# Patient Record
Sex: Male | Born: 1950 | Race: Black or African American | Hispanic: No | Marital: Married | Smoking: Former smoker
Health system: Southern US, Community
[De-identification: ages and names within clinical notes are randomized; demographics above are authoritative.]

## PROBLEM LIST (undated history)

## (undated) DIAGNOSIS — H409 Unspecified glaucoma: Secondary | ICD-10-CM

## (undated) DIAGNOSIS — E119 Type 2 diabetes mellitus without complications: Secondary | ICD-10-CM

## (undated) DIAGNOSIS — J189 Pneumonia, unspecified organism: Secondary | ICD-10-CM

## (undated) DIAGNOSIS — E7800 Pure hypercholesterolemia, unspecified: Secondary | ICD-10-CM

## (undated) DIAGNOSIS — I10 Essential (primary) hypertension: Secondary | ICD-10-CM

## (undated) DIAGNOSIS — C801 Malignant (primary) neoplasm, unspecified: Secondary | ICD-10-CM

## (undated) DIAGNOSIS — S0990XA Unspecified injury of head, initial encounter: Secondary | ICD-10-CM

## (undated) DIAGNOSIS — M1990 Unspecified osteoarthritis, unspecified site: Secondary | ICD-10-CM

## (undated) DIAGNOSIS — G4730 Sleep apnea, unspecified: Secondary | ICD-10-CM

## (undated) HISTORY — PX: CRANIECTOMY FOR DEPRESSED SKULL FRACTURE: SHX5788

## (undated) HISTORY — PX: MANDIBLE SURGERY: SHX707

## (undated) HISTORY — PX: PROSTATECTOMY: SHX69

---

## 1987-06-14 DIAGNOSIS — S0990XA Unspecified injury of head, initial encounter: Secondary | ICD-10-CM

## 1987-06-14 HISTORY — DX: Unspecified injury of head, initial encounter: S09.90XA

## 2009-06-13 DEATH — deceased

## 2010-03-27 ENCOUNTER — Emergency Department: Payer: Self-pay | Admitting: Emergency Medicine

## 2010-05-19 ENCOUNTER — Ambulatory Visit: Payer: Self-pay | Admitting: Family Medicine

## 2010-05-19 ENCOUNTER — Encounter (INDEPENDENT_AMBULATORY_CARE_PROVIDER_SITE_OTHER): Payer: Self-pay

## 2010-05-19 DIAGNOSIS — M25569 Pain in unspecified knee: Secondary | ICD-10-CM

## 2010-05-19 DIAGNOSIS — M1990 Unspecified osteoarthritis, unspecified site: Secondary | ICD-10-CM | POA: Insufficient documentation

## 2010-05-19 DIAGNOSIS — I10 Essential (primary) hypertension: Secondary | ICD-10-CM | POA: Insufficient documentation

## 2010-05-19 DIAGNOSIS — N529 Male erectile dysfunction, unspecified: Secondary | ICD-10-CM

## 2010-07-13 NOTE — Letter (Signed)
Summary: Generic Letter  [facility]  [address]   [place], [place] [address]   Phone: [phone]  Fax: [phone]    05/19/2010  JORELL AGNE [address] [place], [place]  [address]   Bilateral Knees Xray with standing views  diagnoses: bilateral knee pain, osteoarthritis (715.98)  Please fax results to [phone]       Sincerely,   Standley Dakins MD

## 2010-07-13 NOTE — Assessment & Plan Note (Signed)
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  Left greater than right. Pt feeling pain behind the knee cap.  Pt says that he is also out of his ED medication and needs refill.  He says that he normally sees the [facility] as his PCP.  Pt says that he has never had xrays of his knees and never seen an orthopedist.  He is in a physically demanding job situation and will need to rely on the use of his knees without having constant pain.  He says that he quit smoking nearly 30 years ago.  He says that he is taking his BP medications but he does forget to take it on occasion and last night forgot to take it.          Physical Exam General appearance: well developed, well nourished, no acute distress Head: normocephalic, atraumatic Eyes: esotropia strabismus noted Pupils: equal, round, reactive to light Ears: normal, no lesions or deformities Nasal: mucosa pink, nonedematous, no septal deviation, turbinates normal Oral/Pharynx: tongue normal, posterior pharynx without erythema or exudate Neck: neck supple,  trachea midline, no masses Chest/Lungs: no rales, wheezes, or rhonchi bilateral, breath sounds equal without effort Heart: regular rate and  rhythm, no murmur Abdomen: soft, non-tender without obvious organomegaly Extremities: crepitus and medial tenderness in left knee, minimal crepitus in right knee, left joint stable, right joint stable,  Neurological: grossly intact and non-focal Skin: no obvious rashes or lesions MSE: oriented to time, place, and person  Family History: Father  -  Heart Disease  Social History: Pt quit smoking 20+ years ago,  Pt has rare alcohol consumption, Denies Recreational Drugs.    Past History:  Family History: Last updated:  05/19/2010 Father  -  Heart Disease  Social History: Last updated: 05/19/2010 Pt quit smoking 20+ years ago,  Pt has rare alcohol consumption, Denies Recreational Drugs.  Past Medical History: Hypertension Osteoarthritis BPH Erectile Dysfunction  Past Surgical History: Prostatectomy          Vital Signs:  Patient Profile:   60 Years Old Male CC:      Bilateral Knee Pain Height:     68 inches Weight:      193 pounds BMI:     29.45 O2 Sat:      96 % O2 treatment:    Room Air Temp:     97.9 degrees F oral Pulse rate:   77 / minute Pulse rhythm:   regular Resp:     18 per minute BP sitting:   138 / 88  (right arm)  Pt. in pain?   yes    Location:   knee    Intensity:   7    Type:       aching  Vitals Entered By: Levonne Spiller EMT-P (May 19, 2010 3:18 PM)              Is Patient Diabetic? No  Does patient need assistance? Functional Status Self care Ambulation Normal    REVIEW OF SYSTEMS Constitutional Symptoms      Denies fever, chills, night sweats, weight loss, weight gain, and fatigue.  Eyes       Complains of eye drainage.      Denies change in vision, eye pain, glasses, contact lenses, and eye surgery.  Ear/Nose/Throat/Mouth       Denies hearing loss/aids, change in hearing, ear pain, ear discharge, dizziness, frequent runny nose, frequent nose bleeds, sinus problems, sore throat, hoarseness, and tooth pain or bleeding.  Respiratory       Denies dry cough, productive cough, wheezing, shortness of breath, asthma, bronchitis, and emphysema/COPD.  Cardiovascular       Denies murmurs, chest pain, and tires easily with exhertion.    Gastrointestinal       Denies stomach pain, nausea/vomiting, diarrhea, constipation, blood in bowel movements, and indigestion. Genitourniary       Denies painful urination, kidney stones, and loss of urinary control. Neurological       Denies paralysis, seizures, and fainting/blackouts. Musculoskeletal        Complains of joint pain and joint stiffness.      Denies muscle pain, decreased range of motion, redness, swelling, muscle weakness, and gout.  Skin       Denies bruising, unusual mles/lumps or sores, and hair/skin or nail changes.  Psych       Denies mood changes, temper/anger issues, anxiety/stress, speech problems, depression, and sleep problems. Assessment & Plan:  Medical Problems Added: 1)  Dx of Knee Pain, Left  (ICD-719.46) 2)  Dx of Erectile Dysfunction, Organic  (ICD-607.84) 3)  Dx of Unspecified Essential Hypertension  (ICD-401.9) 4)  Dx of Osteoarthros Unspec Gen/loc Oth Spec Sites  (ICD-715.98)  Updated Medical Problems: 1)  Dx of Knee Pain, Left  (ICD-719.46) 2)  Dx of Erectile Dysfunction, Organic  (ICD-607.84) 3)  Dx of Unspecified Essential Hypertension  (ICD-401.9) 4)  Dx of Osteoarthros Unspec Gen/loc Oth Spec Sites  (ICD-715.98)  New Prescriptions/Refills: 1)  Amlodipine Besylate 10 Mg Tabs (Amlodipine besylate) .Marland Kitchen.. 1 per day 2)  Flunisolide 0.025 % Soln (Flunisolide) .... Twice per day 3)  Loratadine Allergy Relief 10 Mg Tbdp (Loratadine) .Marland Kitchen.. 1 per day 4)  Simvastatin 10 Mg Tabs (Simvastatin) .Marland Kitchen.. 1 per day 5)  Meloxicam 7.5 Mg Tabs (Meloxicam) .... Take 1 tablet by mouth daily with food as needed for knee pain 6)  Levitra 20 Mg Tabs (Vardenafil hcl) .... Use as directed - avoid nitroglycerin containing products  Current Medication List: 1)  Amlodipine Besylate 10 Mg Tabs (Amlodipine besylate) .Marland Kitchen.. 1 per day 2)  Flunisolide 0.025 % Soln (Flunisolide) .... Twice per day 3)  Loratadine Allergy Relief 10 Mg Tbdp (Loratadine) .Marland Kitchen.. 1 per day 4)  Simvastatin 10 Mg Tabs (Simvastatin) .Marland Kitchen.. 1 per day 5)  Meloxicam 7.5 Mg Tabs (Meloxicam) .... Take 1 tablet by mouth daily with food as needed for knee pain 6)  Levitra 20 Mg Tabs (Vardenafil hcl) .... Use as directed - avoid nitroglycerin containing products  New Orders: 1)  Orthopedic Referral [Ortho]  Disposition:  return to clinic in 2 weeks  Additional Plan/Instructions: 1)  Orthopedic Referral Made 2)  Meloxicam 7.5 mg by mouth daily with food for knee pain

## 2010-07-13 NOTE — Miscellaneous (Signed)
Summary: Orthopedics Referral  Clinical Lists Changes  Pt. has an appt. on Tues. 05/25/2010 at 10:30am, with [facility]- Dr. Martha Clan. Pt. being seen for left knee pain./ rwt

## 2010-08-13 ENCOUNTER — Encounter: Payer: Self-pay | Admitting: Family Medicine

## 2010-08-27 ENCOUNTER — Encounter: Payer: Self-pay | Admitting: Family Medicine

## 2010-08-27 ENCOUNTER — Ambulatory Visit (INDEPENDENT_AMBULATORY_CARE_PROVIDER_SITE_OTHER): Payer: BC Managed Care – PPO | Admitting: Family Medicine

## 2010-08-27 DIAGNOSIS — M1990 Unspecified osteoarthritis, unspecified site: Secondary | ICD-10-CM

## 2010-08-27 DIAGNOSIS — M545 Low back pain, unspecified: Secondary | ICD-10-CM

## 2010-08-31 NOTE — Assessment & Plan Note (Signed)
Summary: Back Pain   Vital Signs:  Patient Profile:   60 Years Old Male CC:      Back Pain Height:     68 inches Weight:      196 pounds O2 Sat:      97 % O2 treatment:    Room Air Temp:     98.2 degrees F oral Pulse rate:   76 / minute Pulse rhythm:   regular Resp:     13 per minute BP sitting:   136 / 87  (left arm) Cuff size:   regular  Pt. in pain?   yes    Location:   back    Intensity:   6/10    Type:       aching                   Current Allergies (reviewed today): ! LISINOPRILHistory of Present Illness History from: patient Reason for visit: see chief complaint Chief Complaint: Back Pain History of Present Illness: The patient is presenting today because he was not able to go to work this morning.  He is having lower back pain.  He says that he was working out and lifting weights yesterday and noticed some pain in the lower back when bending over.  He says that it is 6/10 and aching in the spine area of the lower back at the tailbone area.  He says he has no loss of strength or sensation.  He has no loss of bowel or bladder control.  He says that he usually feels spasm in the back with bending or certain movements.   He says that he has  a back brace to wear.  He says that he went to the orthopedist and was given medication but he stopped taking it.  He says that his knees still hurt.  He says that he has no abdominal pain or chest pains.     REVIEW OF SYSTEMS Constitutional Symptoms      Denies fever, chills, night sweats, weight loss, weight gain, and fatigue.  Eyes       Complains of eye drainage.      Denies change in vision, eye pain, glasses, contact lenses, and eye surgery. Ear/Nose/Throat/Mouth       Denies hearing loss/aids, change in hearing, ear pain, ear discharge, dizziness, frequent runny nose, frequent nose bleeds, sinus problems, sore throat, hoarseness, and tooth pain or bleeding.  Respiratory       Denies dry cough, productive cough, wheezing,  shortness of breath, asthma, bronchitis, and emphysema/COPD.  Cardiovascular       Denies murmurs, chest pain, and tires easily with exhertion.    Gastrointestinal       Denies stomach pain, nausea/vomiting, diarrhea, constipation, blood in bowel movements, and indigestion. Genitourniary       Denies painful urination, kidney stones, and loss of urinary control. Neurological       Denies paralysis, seizures, and fainting/blackouts. Musculoskeletal       Complains of joint pain.      Denies muscle pain, joint stiffness, decreased range of motion, redness, swelling, muscle weakness, and gout.      Comments: low back pain, bilateral knee pain;  Skin       Denies bruising, unusual mles/lumps or sores, and hair/skin or nail changes.  Psych       Denies mood changes, temper/anger issues, anxiety/stress, speech problems, depression, and sleep problems.  Past History:  Family History: Last updated: 05/19/2010 Father  -  Heart Disease  Social History: Last updated: 08/27/2010 Pt quit smoking 20+ years ago,  Pt has rare alcohol consumption, Denies Recreational Drugs. Pt works as a Biochemist, clinical for [employer].   Past Medical History: Hypertension Osteoarthritis BPH Erectile Dysfunction bilateral DDD knees  Past Surgical History: Reviewed history from 05/19/2010 and no changes required. Prostatectomy  Family History: Reviewed history from 05/19/2010 and no changes required. Father  -  Heart Disease  Social History: Pt quit smoking 20+ years ago,  Pt has rare alcohol consumption, Denies Recreational Drugs. Pt works as a Biochemist, clinical for [employer].  Physical Exam General appearance: well developed, well nourished, no acute distress Head: normocephalic, atraumatic Eyes: conjunctivae and lids normal Pupils: equal, round, reactive to light Ears: normal, no lesions or deformities Nasal: mucosa pink, nonedematous, no septal  deviation, turbinates normal Oral/Pharynx: tongue normal, posterior pharynx without erythema or exudate Neck: neck supple,  trachea midline, no masses Chest/Lungs: no rales, wheezes, or rhonchi bilateral, breath sounds equal without effort Heart: regular rate and  rhythm, no murmur Abdomen: soft, non-tender without obvious organomegaly GU: no suprapubic TTP   Extremities: normal extremities Neurological: grossly intact and non-focal Back: tender musculature paraspinal muscles lower back,  straight leg raises negative on left but positive on Right leg at 40 deg;   deep tendon reflexes 2+ at achilles and patella Skin: no obvious rashes or lesions MSE: oriented to time, place, and person Assessment New Problems: LOW BACK PAIN, ACUTE (ICD-724.2)   Patient Education: Patient and/or caregiver instructed in the following: rest, fluids, Tylenol prn, Ibuprofen prn. The risks, benefits and possible side effects were clearly explained and discussed with the patient.  The patient verbalized clear understanding.  The patient was given instructions to return if symptoms don't improve, worsen or new changes develop.  If it is not during clinic hours and the patient cannot get back to this clinic then the patient was told to seek medical care at an available urgent care or emergency department.  The patient verbalized understanding.   Demonstrates willingness to comply.  Plan New Medications/Changes: CYCLOBENZAPRINE HCL 5 MG TABS (CYCLOBENZAPRINE HCL) take 1 by mouth at hs as needed back spasm: Caution will cause drowsiness  #7 x 0, 08/27/2010, Haillie Radu MD TRAMADOL HCL 50 MG TABS (TRAMADOL HCL) take 1 by mouth every 6 hours as needed severe low back pain: Caution will cause drowsiness  #20 x 0, 08/27/2010, Shontel Santee MD IBUPROFEN 800 MG TABS (IBUPROFEN) take 1 by mouth every 8 hours with food as needed back pain.  #15 x 0, 08/27/2010, Standley Dakins MD  Follow Up: Follow up in 2-3 days if  no improvement, Follow up on an as needed basis, Follow up with Primary Physician  The patient and/or caregiver has been counseled thoroughly with regard to medications prescribed including dosage, schedule, interactions, rationale for use, and possible side effects and they verbalize understanding.  Diagnoses and expected course of recovery discussed and will return if not improved as expected or if the condition worsens. Patient and/or caregiver verbalized understanding.  Prescriptions: CYCLOBENZAPRINE HCL 5 MG TABS (CYCLOBENZAPRINE HCL) take 1 by mouth at hs as needed back spasm: Caution will cause drowsiness  #7 x 0   Entered and Authorized by:   Standley Dakins MD   Signed by:   Standley Dakins MD on 08/27/2010   Method used:   Handwritten   RxID:   [id number] TRAMADOL HCL 50 MG TABS (TRAMADOL HCL) take 1 by  mouth every 6 hours as needed severe low back pain: Caution will cause drowsiness  #20 x 0   Entered and Authorized by:   Standley Dakins MD   Signed by:   Standley Dakins MD on 08/27/2010   Method used:   Handwritten   RxID:   [id number] IBUPROFEN 800 MG TABS (IBUPROFEN) take 1 by mouth every 8 hours with food as needed back pain.  #15 x 0   Entered and Authorized by:   Standley Dakins MD   Signed by:   Standley Dakins MD on 08/27/2010   Method used:   Handwritten   RxID:   [id number]   Patient Instructions: 1)  Take 650-1000mg  of Tylenol every 4-6 hours as needed for relief of pain or comfort of fever AVOID taking more than 4000mg   in a 24 hour period (can cause liver damage in higher doses). 2)  Take 800mg  of Ibuprofen (Advil, Motrin) with food every 8  hours as needed for relief of pain or comfort of fever. 3)  Most patients (90%) with low back pain will improve with time (2-6 weeks). Keep active but avoid activities that are painful. Apply moist heat and/or ice to lower back several times a day. 4)  Start wearing a back brace daily while at work until  back is feeling better.  5)  No heavy lifting over 15 lbs for next 2 weeks.  6)  Return or go to the ER if no improvement or symptoms getting worse.   7)  The patient was informed that there is no on-call provider or services available at this clinic during off-hours (when the clinic is closed).  If the patient developed a problem or concern that required immediate attention, the patient was advised to go the the nearest available urgent care or emergency department for medical care.  The patient verbalized understanding.       Pt advised to Return or go to the ER if no improvement or symptoms getting worse.  The patient verbalized clear understanding.  Rodney Langton, MD, CDE, [employer]

## 2010-09-09 NOTE — Letter (Signed)
Summary: history form   history form   Imported By: Eugenio Hoes 08/30/2010 13:39:05  _____________________________________________________________________  External Attachment:    Type:   Image     Comment:   External Document

## 2010-09-09 NOTE — Letter (Signed)
Summary: history form   history form   Imported By: Eugenio Hoes 08/30/2010 13:36:46  _____________________________________________________________________  External Attachment:    Type:   Image     Comment:   External Document

## 2013-06-13 DIAGNOSIS — J189 Pneumonia, unspecified organism: Secondary | ICD-10-CM

## 2013-06-13 HISTORY — DX: Pneumonia, unspecified organism: J18.9

## 2015-01-06 ENCOUNTER — Encounter
Admission: RE | Admit: 2015-01-06 | Discharge: 2015-01-06 | Disposition: A | Discharge status: Home / Self Care | Payer: Managed Care, Other (non HMO) | Source: Ambulatory Visit | Attending: Anesthesiology | Admitting: Anesthesiology

## 2015-01-06 ENCOUNTER — Encounter: Payer: Self-pay | Admitting: *Deleted

## 2015-01-06 DIAGNOSIS — Z818 Family history of other mental and behavioral disorders: Secondary | ICD-10-CM | POA: Diagnosis not present

## 2015-01-06 DIAGNOSIS — Z825 Family history of asthma and other chronic lower respiratory diseases: Secondary | ICD-10-CM | POA: Diagnosis not present

## 2015-01-06 DIAGNOSIS — D176 Benign lipomatous neoplasm of spermatic cord: Secondary | ICD-10-CM | POA: Diagnosis not present

## 2015-01-06 DIAGNOSIS — Z8249 Family history of ischemic heart disease and other diseases of the circulatory system: Secondary | ICD-10-CM | POA: Diagnosis not present

## 2015-01-06 DIAGNOSIS — M069 Rheumatoid arthritis, unspecified: Secondary | ICD-10-CM | POA: Diagnosis not present

## 2015-01-06 DIAGNOSIS — Z8261 Family history of arthritis: Secondary | ICD-10-CM | POA: Diagnosis not present

## 2015-01-06 DIAGNOSIS — M170 Bilateral primary osteoarthritis of knee: Secondary | ICD-10-CM | POA: Diagnosis not present

## 2015-01-06 DIAGNOSIS — R7309 Other abnormal glucose: Secondary | ICD-10-CM | POA: Diagnosis not present

## 2015-01-06 DIAGNOSIS — Z8546 Personal history of malignant neoplasm of prostate: Secondary | ICD-10-CM | POA: Diagnosis not present

## 2015-01-06 DIAGNOSIS — Z808 Family history of malignant neoplasm of other organs or systems: Secondary | ICD-10-CM | POA: Diagnosis not present

## 2015-01-06 DIAGNOSIS — H409 Unspecified glaucoma: Secondary | ICD-10-CM | POA: Diagnosis not present

## 2015-01-06 DIAGNOSIS — K4090 Unilateral inguinal hernia, without obstruction or gangrene, not specified as recurrent: Secondary | ICD-10-CM | POA: Diagnosis present

## 2015-01-06 DIAGNOSIS — Z87891 Personal history of nicotine dependence: Secondary | ICD-10-CM | POA: Diagnosis not present

## 2015-01-06 DIAGNOSIS — G4730 Sleep apnea, unspecified: Secondary | ICD-10-CM | POA: Diagnosis not present

## 2015-01-06 DIAGNOSIS — E780 Pure hypercholesterolemia: Secondary | ICD-10-CM | POA: Diagnosis not present

## 2015-01-06 DIAGNOSIS — Z801 Family history of malignant neoplasm of trachea, bronchus and lung: Secondary | ICD-10-CM | POA: Diagnosis not present

## 2015-01-06 DIAGNOSIS — Z888 Allergy status to other drugs, medicaments and biological substances status: Secondary | ICD-10-CM | POA: Diagnosis not present

## 2015-01-06 DIAGNOSIS — I10 Essential (primary) hypertension: Secondary | ICD-10-CM | POA: Diagnosis not present

## 2015-01-06 DIAGNOSIS — Z811 Family history of alcohol abuse and dependence: Secondary | ICD-10-CM | POA: Diagnosis not present

## 2015-01-06 DIAGNOSIS — Z79899 Other long term (current) drug therapy: Secondary | ICD-10-CM | POA: Diagnosis not present

## 2015-01-06 HISTORY — DX: Pneumonia, unspecified organism: J18.9

## 2015-01-06 NOTE — Patient Instructions (Signed)
  Your procedure is scheduled on: 01-09-15 Report to [place] To find out your arrival time please call [phone] between 1PM - 3PM on 01-08-15 (THURSDAY)  Remember: Instructions that are not followed completely may result in serious medical risk, up to and including death, or upon the discretion of your surgeon and anesthesiologist your surgery may need to be rescheduled.    __X__ 1. Do not eat food or drink liquids after midnight. No gum chewing or hard candies.     __X__ 2. No Alcohol for 24 hours before or after surgery.   ____ 3. Bring all medications with you on the day of surgery if instructed.    __X__ 4. Notify your doctor if there is any change in your medical condition     (cold, fever, infections).     Do not wear jewelry, make-up, hairpins, clips or nail polish.  Do not wear lotions, powders, or perfumes. You may wear deodorant.  Do not shave 48 hours prior to surgery. Men may shave face and neck.  Do not bring valuables to the hospital.    [facility] is not responsible for any belongings or valuables.               Contacts, dentures or bridgework may not be worn into surgery.  Leave your suitcase in the car. After surgery it may be brought to your room.  For patients admitted to the hospital, discharge time is determined by your  treatment team.   Patients discharged the day of surgery will not be allowed to drive home.   Please read over the following fact sheets that you were given:     _X___ Take these medicines the morning of surgery with A SIP OF WATER:    1. AMLODIPINE  2.   3.   4.  5.  6.  ____ Fleet Enema (as directed)   _X___ Use CHG Soap as directed  ____ Use inhalers on the day of surgery  ____ Stop metformin 2 days prior to surgery    ____ Take 1/2 of usual insulin dose the night before surgery and none on the morning of surgery.   ____ Stop Coumadin/Plavix/aspirin-N/A  ____ Stop Anti-inflammatories-NO NSAIDS OR  ASPIRIN PRODUCTS-TYLENOL OK   ____ Stop supplements until after surgery.    _X___ Bring C-Pap to the hospital.

## 2015-01-07 NOTE — Pre-Procedure Instructions (Signed)
Called dr Oren Section regrding ekg-dr carroll said to fax ekg to dr Netty Starring and let him review ekg-called pcp office and notified receptionist that i faxed it over

## 2015-01-08 NOTE — Pre-Procedure Instructions (Signed)
CLEARANCE NOTE SENT BACK FROM DR Netty Starring REGARDING EKG-PT CLEARED FOR SURGERY AND IS LOW RISK PER DR LINTHAVONG-PLACED CLEARANCE NOTE ON CHART

## 2015-01-09 ENCOUNTER — Encounter: Payer: Self-pay | Admitting: *Deleted

## 2015-01-09 ENCOUNTER — Ambulatory Visit
Admission: RE | Admit: 2015-01-09 | Discharge: 2015-01-09 | Disposition: A | Discharge status: Home / Self Care | Payer: Managed Care, Other (non HMO) | Source: Ambulatory Visit | Attending: Surgery | Admitting: Surgery

## 2015-01-09 ENCOUNTER — Ambulatory Visit: Payer: Managed Care, Other (non HMO) | Admitting: *Deleted

## 2015-01-09 ENCOUNTER — Encounter
Admission: RE | Disposition: A | Discharge status: Home / Self Care | Payer: Self-pay | Source: Ambulatory Visit | Attending: Surgery

## 2015-01-09 DIAGNOSIS — M069 Rheumatoid arthritis, unspecified: Secondary | ICD-10-CM | POA: Insufficient documentation

## 2015-01-09 DIAGNOSIS — Z811 Family history of alcohol abuse and dependence: Secondary | ICD-10-CM | POA: Insufficient documentation

## 2015-01-09 DIAGNOSIS — Z8261 Family history of arthritis: Secondary | ICD-10-CM | POA: Insufficient documentation

## 2015-01-09 DIAGNOSIS — Z87891 Personal history of nicotine dependence: Secondary | ICD-10-CM | POA: Insufficient documentation

## 2015-01-09 DIAGNOSIS — Z79899 Other long term (current) drug therapy: Secondary | ICD-10-CM | POA: Insufficient documentation

## 2015-01-09 DIAGNOSIS — K4090 Unilateral inguinal hernia, without obstruction or gangrene, not specified as recurrent: Secondary | ICD-10-CM | POA: Insufficient documentation

## 2015-01-09 DIAGNOSIS — Z8546 Personal history of malignant neoplasm of prostate: Secondary | ICD-10-CM | POA: Insufficient documentation

## 2015-01-09 DIAGNOSIS — Z808 Family history of malignant neoplasm of other organs or systems: Secondary | ICD-10-CM | POA: Insufficient documentation

## 2015-01-09 DIAGNOSIS — R7309 Other abnormal glucose: Secondary | ICD-10-CM | POA: Insufficient documentation

## 2015-01-09 DIAGNOSIS — Z818 Family history of other mental and behavioral disorders: Secondary | ICD-10-CM | POA: Insufficient documentation

## 2015-01-09 DIAGNOSIS — Z801 Family history of malignant neoplasm of trachea, bronchus and lung: Secondary | ICD-10-CM | POA: Insufficient documentation

## 2015-01-09 DIAGNOSIS — Z825 Family history of asthma and other chronic lower respiratory diseases: Secondary | ICD-10-CM | POA: Insufficient documentation

## 2015-01-09 DIAGNOSIS — H409 Unspecified glaucoma: Secondary | ICD-10-CM | POA: Insufficient documentation

## 2015-01-09 DIAGNOSIS — M170 Bilateral primary osteoarthritis of knee: Secondary | ICD-10-CM | POA: Insufficient documentation

## 2015-01-09 DIAGNOSIS — G4730 Sleep apnea, unspecified: Secondary | ICD-10-CM | POA: Insufficient documentation

## 2015-01-09 DIAGNOSIS — I10 Essential (primary) hypertension: Secondary | ICD-10-CM | POA: Insufficient documentation

## 2015-01-09 DIAGNOSIS — D176 Benign lipomatous neoplasm of spermatic cord: Secondary | ICD-10-CM | POA: Insufficient documentation

## 2015-01-09 DIAGNOSIS — Z888 Allergy status to other drugs, medicaments and biological substances status: Secondary | ICD-10-CM | POA: Insufficient documentation

## 2015-01-09 DIAGNOSIS — E780 Pure hypercholesterolemia: Secondary | ICD-10-CM | POA: Insufficient documentation

## 2015-01-09 DIAGNOSIS — Z8249 Family history of ischemic heart disease and other diseases of the circulatory system: Secondary | ICD-10-CM | POA: Insufficient documentation

## 2015-01-09 HISTORY — DX: Unspecified osteoarthritis, unspecified site: M19.90

## 2015-01-09 HISTORY — DX: Essential (primary) hypertension: I10

## 2015-01-09 HISTORY — DX: Type 2 diabetes mellitus without complications: E11.9

## 2015-01-09 HISTORY — DX: Sleep apnea, unspecified: G47.30

## 2015-01-09 HISTORY — PX: INGUINAL HERNIA REPAIR: SHX194

## 2015-01-09 HISTORY — DX: Unspecified glaucoma: H40.9

## 2015-01-09 HISTORY — DX: Unspecified injury of head, initial encounter: S09.90XA

## 2015-01-09 HISTORY — DX: Malignant (primary) neoplasm, unspecified: C80.1

## 2015-01-09 HISTORY — DX: Pure hypercholesterolemia, unspecified: E78.00

## 2015-01-09 LAB — GLUCOSE, CAPILLARY
GLUCOSE-CAPILLARY: 79 mg/dL (ref 65–99)
Glucose-Capillary: 75 mg/dL (ref 65–99)

## 2015-01-09 SURGERY — REPAIR, HERNIA, INGUINAL, ADULT
Anesthesia: General | Laterality: Right | Wound class: Clean

## 2015-01-09 MED ORDER — ONDANSETRON HCL 4 MG/2ML IJ SOLN
INTRAMUSCULAR | Status: DC | PRN
  Administered 2015-01-09: 4 mg via INTRAVENOUS

## 2015-01-09 MED ORDER — PHENYLEPHRINE HCL 10 MG/ML IJ SOLN
INTRAMUSCULAR | Status: DC | PRN
  Administered 2015-01-09: 50 ug via INTRAVENOUS
  Administered 2015-01-09 (×3): 100 ug via INTRAVENOUS
  Administered 2015-01-09: 50 ug via INTRAVENOUS
  Administered 2015-01-09: 100 ug via INTRAVENOUS

## 2015-01-09 MED ORDER — DEXAMETHASONE SODIUM PHOSPHATE 10 MG/ML IJ SOLN: 8.0000 mg | Freq: Once | INTRAMUSCULAR | Status: DC | PRN

## 2015-01-09 MED ORDER — METOCLOPRAMIDE HCL 5 MG/ML IJ SOLN
INTRAMUSCULAR | Status: AC
  Filled 2015-01-09: qty 2

## 2015-01-09 MED ORDER — FENTANYL CITRATE (PF) 100 MCG/2ML IJ SOLN
25.0000 ug | INTRAMUSCULAR | Status: DC | PRN
  Administered 2015-01-09: 50 ug via INTRAVENOUS

## 2015-01-09 MED ORDER — CEFAZOLIN SODIUM 1-5 GM-% IV SOLN
1.0000 g | Freq: Once | INTRAVENOUS | Status: AC
  Administered 2015-01-09: 1 g via INTRAVENOUS

## 2015-01-09 MED ORDER — MIDAZOLAM HCL 2 MG/2ML IJ SOLN
INTRAMUSCULAR | Status: DC | PRN
  Administered 2015-01-09: 2 mg via INTRAVENOUS

## 2015-01-09 MED ORDER — GLYCOPYRROLATE 0.2 MG/ML IJ SOLN
INTRAMUSCULAR | Status: DC | PRN
  Administered 2015-01-09: 0.2 mg via INTRAVENOUS

## 2015-01-09 MED ORDER — HYDROCODONE-ACETAMINOPHEN 5-325 MG PO TABS
1.0000 | ORAL_TABLET | ORAL | Status: DC | PRN
Start: 2015-01-09 — End: 2015-01-09

## 2015-01-09 MED ORDER — HYDROCODONE-ACETAMINOPHEN 5-325 MG PO TABS: 1.0000 | ORAL_TABLET | ORAL | Status: AC | PRN

## 2015-01-09 MED ORDER — SUCCINYLCHOLINE CHLORIDE 20 MG/ML IJ SOLN
INTRAMUSCULAR | Status: DC | PRN
  Administered 2015-01-09: 100 mg via INTRAVENOUS

## 2015-01-09 MED ORDER — FAMOTIDINE 20 MG PO TABS
20.0000 mg | ORAL_TABLET | Freq: Once | ORAL | Status: AC
  Administered 2015-01-09: 20 mg via ORAL

## 2015-01-09 MED ORDER — CEFAZOLIN SODIUM 1-5 GM-% IV SOLN
INTRAVENOUS | Status: AC
  Administered 2015-01-09: 1 g via INTRAVENOUS
  Filled 2015-01-09: qty 50

## 2015-01-09 MED ORDER — PROPOFOL 10 MG/ML IV BOLUS
INTRAVENOUS | Status: DC | PRN
  Administered 2015-01-09: 30 mg via INTRAVENOUS
  Administered 2015-01-09: 120 mg via INTRAVENOUS

## 2015-01-09 MED ORDER — METOCLOPRAMIDE HCL 5 MG/ML IJ SOLN
10.0000 mg | Freq: Once | INTRAMUSCULAR | Status: AC | PRN
  Administered 2015-01-09: 10 mg via INTRAVENOUS

## 2015-01-09 MED ORDER — BUPIVACAINE-EPINEPHRINE (PF) 0.5% -1:200000 IJ SOLN
INTRAMUSCULAR | Status: AC
  Filled 2015-01-09: qty 30

## 2015-01-09 MED ORDER — FAMOTIDINE 20 MG PO TABS
ORAL_TABLET | ORAL | Status: AC
  Administered 2015-01-09: 20 mg via ORAL
  Filled 2015-01-09: qty 1

## 2015-01-09 MED ORDER — LIDOCAINE HCL (CARDIAC) 20 MG/ML IV SOLN
INTRAVENOUS | Status: DC | PRN
  Administered 2015-01-09: 100 mg via INTRAVENOUS

## 2015-01-09 MED ORDER — LACTATED RINGERS IV SOLN
INTRAVENOUS | Status: DC
  Administered 2015-01-09: 12:00:00 via INTRAVENOUS

## 2015-01-09 MED ORDER — FENTANYL CITRATE (PF) 100 MCG/2ML IJ SOLN
INTRAMUSCULAR | Status: AC
  Filled 2015-01-09: qty 2

## 2015-01-09 MED ORDER — EPHEDRINE SULFATE 50 MG/ML IJ SOLN
INTRAMUSCULAR | Status: DC | PRN
Start: 2015-01-09 — End: 2015-01-09
  Administered 2015-01-09 (×2): 5 mg via INTRAVENOUS

## 2015-01-09 MED ORDER — FENTANYL CITRATE (PF) 100 MCG/2ML IJ SOLN
INTRAMUSCULAR | Status: DC | PRN
  Administered 2015-01-09 (×2): 50 ug via INTRAVENOUS

## 2015-01-09 MED ORDER — BUPIVACAINE-EPINEPHRINE (PF) 0.5% -1:200000 IJ SOLN
INTRAMUSCULAR | Status: DC | PRN
  Administered 2015-01-09: 20 mL

## 2015-01-09 MED ORDER — HYDROMORPHONE HCL 1 MG/ML IJ SOLN: 0.2500 mg | INTRAMUSCULAR | Status: DC | PRN

## 2015-01-09 SURGICAL SUPPLY — 24 items
BLADE SURG 15 STRL LF DISP TIS (BLADE) ×1 IMPLANT
BLADE SURG 15 STRL SS (BLADE) ×2
CANISTER SUCT 1200ML W/VALVE (MISCELLANEOUS) ×3 IMPLANT
CHLORAPREP W/TINT 26ML (MISCELLANEOUS) ×3 IMPLANT
DRAIN PENROSE 5/8X18 LTX STRL (WOUND CARE) ×3 IMPLANT
DRAPE PED LAPAROTOMY (DRAPES) ×3 IMPLANT
GLOVE BIO SURGEON STRL SZ7.5 (GLOVE) ×15 IMPLANT
GOWN STRL REUS W/ TWL LRG LVL3 (GOWN DISPOSABLE) ×3 IMPLANT
GOWN STRL REUS W/TWL LRG LVL3 (GOWN DISPOSABLE) ×6
KIT RM TURNOVER STRD PROC AR (KITS) ×3 IMPLANT
LABEL OR SOLS (LABEL) ×3 IMPLANT
LIQUID BAND (GAUZE/BANDAGES/DRESSINGS) ×3 IMPLANT
MESH SYNTHETIC 4X6 SOFT BARD (Mesh General) ×1 IMPLANT
MESH SYNTHETIC SOFT BARD 4X6 (Mesh General) ×2 IMPLANT
NDL SAFETY 25GX1.5 (NEEDLE) ×3 IMPLANT
NS IRRIG 500ML POUR BTL (IV SOLUTION) ×3 IMPLANT
PACK BASIN MINOR ARMC (MISCELLANEOUS) ×3 IMPLANT
PAD GROUND ADULT SPLIT (MISCELLANEOUS) ×3 IMPLANT
SUT CHROMIC 4 0 RB 1X27 (SUTURE) ×3 IMPLANT
SUT MNCRL AB 4-0 PS2 18 (SUTURE) ×3 IMPLANT
SUT SURGILON 0 30 BLK (SUTURE) ×9 IMPLANT
SUT VIC AB 4-0 SH 27 (SUTURE) ×2
SUT VIC AB 4-0 SH 27XANBCTRL (SUTURE) ×1 IMPLANT
SYRINGE 10CC LL (SYRINGE) ×3 IMPLANT

## 2015-01-09 NOTE — Anesthesia Preprocedure Evaluation (Signed)
Anesthesia Evaluation  Patient identified by MRN, date of birth, ID band Patient awake    Reviewed: Allergy & Precautions, NPO status , Patient's Chart, lab work & pertinent test results  Airway Mallampati: II  TM Distance: >3 FB Neck ROM: Limited   Comment: Retrognathic, anterior larynx, glidescope. Dental  (+) Teeth Intact   Pulmonary sleep apnea and Continuous Positive Airway Pressure Ventilation , former smoker,  breath sounds clear to auscultation        Cardiovascular hypertension, Pt. on medications Rhythm:Regular Rate:Normal     Neuro/Psych    GI/Hepatic   Endo/Other  diabetes, Well Controlled, Type 2Diet controlled.  Renal/GU      Musculoskeletal   Abdominal   Peds  Hematology   Anesthesia Other Findings   Reproductive/Obstetrics                             Anesthesia Physical Anesthesia Plan  ASA: III  Anesthesia Plan: General   Post-op Pain Management:    Induction: Intravenous  Airway Management Planned: Oral ETT and Video Laryngoscope Planned  Additional Equipment:   Intra-op Plan:   Post-operative Plan: Extubation in OR  Informed Consent: I have reviewed the patients History and Physical, chart, labs and discussed the procedure including the risks, benefits and alternatives for the proposed anesthesia with the patient or authorized representative who has indicated his/her understanding and acceptance.     Plan Discussed with: CRNA  Anesthesia Plan Comments:         Anesthesia Quick Evaluation

## 2015-01-09 NOTE — Discharge Instructions (Addendum)
Take Tylenol or Norco if needed for pain.May shower. Avoid straining and heavy lifting   AMBULATORY SURGERY  DISCHARGE INSTRUCTIONS   1) The drugs that you were given will stay in your system until tomorrow so for the next 24 hours you should not:  A) Drive an automobile B) Make any legal decisions C) Drink any alcoholic beverage   2) You may resume regular meals tomorrow.  Today it is better to start with liquids and gradually work up to solid foods.  You may eat anything you prefer, but it is better to start with liquids, then soup and crackers, and gradually work up to solid foods.   3) Please notify your doctor immediately if you have any unusual bleeding, trouble breathing, redness and pain at the surgery site, drainage, fever, or pain not relieved by medication.    4) Additional Instructions:        Please contact your physician with any problems or Same Day Surgery at [phone], Monday through Friday 6 am to 4 pm, or [place] at [facility] number at [phone].

## 2015-01-09 NOTE — Op Note (Signed)
OPERATIVE REPORT  PREOPERATIVE DIAGNOSIS: right inguinal hernia  POSTOPERATIVE DIAGNOSIS:right  inguinal hernia  PROCEDURE:  right inguinal hernia repair  ANESTHESIA:  General  SURGEON:  Rochel Brome M.D.  INDICATIONS: He had recent development of the right groin pain. A right inguinal hernia was demonstrated on physical exam and repair is recommended for definitive treatment  With the patient on the operating table in the supine position the right lower quadrant was prepared with clippers and with ChloraPrep and draped in a sterile manner. A transversely oriented suprapubic incision was made and carried down through subcutaneous tissues. Electrocautery was used for hemostasis. One traversing vein was divided between 4-0 chromic suture ligatures. The Scarpa's fascia was incised. The external oblique aponeurosis was incised along the course of its fibers to open the external ring and expose the inguinal cord structures. The cord structures were mobilized. A Penrose drain was passed around the cord structures for traction. Cremaster fibers were separated to expose an indirect hernia sac. This sac was dissected free from surrounding structures and followed down into the internal ring. The sac was opened. Its continuity with the peritoneal cavity was demonstrated. The sac was a proximally 4 cm in length. A high ligation of the sac was done with a 4-0 Vicryl suture ligature. The sac was excised and was not sent for pathology. The was also a cord lipoma which was dissected free from surrounding structures up into the internal ring and suture ligated with 0 Surgilon and amputated and was not sent for pathology. The floor of the inguinal canal was repaired with 0 Surgilon sutures suturing the conjoined tendon to the shelving edge of the inguinal ligament incorporating transversalis fascia into the repair. The last stitch led to satisfactory narrowing of the internal ring. A relaxing incision was made medially.  An onlay Bard soft mesh was cut to create a 3 x 5 cm oval shape. A notch was cut to straddle the internal ring.. This was sutured to the repair with interrupted 0 Surgilon sutures and also sutured medially to the deep fascia medial to the relaxing incision and on both sides of the internal ring. Next after seeing hemostasis was intact the cord structures were replaced along the floor of the inguinal canal. The cut edges of the external oblique aponeurosis were closed with a running 4-0 Vicryl suture to re-create the external ring. The deep fascia superior and lateral to the repair site was infiltrated with half percent Sensorcaine with epinephrine. Subcutaneous tissues were also infiltrated. The Scarpa's fascia was closed with interrupted 4-0 Monocryl sutures. The skin was closed with running 4-0 Monocryl subcuticular suture and LiquiBand. The testicle remained in the scrotum  The patient appeared to be in satisfactory condition and was prepared for transfer to the recovery room.  Rochel Brome M.D.

## 2015-01-09 NOTE — Anesthesia Postprocedure Evaluation (Signed)
  Anesthesia Post-op Note  Patient: Anthony Macdonald  Procedure(s) Performed: Procedure(s): Right inguinal hernia repair  (Right)  Anesthesia type:General  Patient location: PACU  Post pain: Pain level controlled  Post assessment: Post-op Vital signs reviewed, Patient's Cardiovascular Status Stable, Respiratory Function Stable, Patent Airway and No signs of Nausea or vomiting  Post vital signs: Reviewed and stable  Last Vitals:  Filed Vitals:   01/09/15 1455  BP: 118/74  Pulse: 77  Temp: 36.3 C  Resp: 16    Level of consciousness: awake, alert  and patient cooperative  Complications: No apparent anesthesia complications

## 2015-01-09 NOTE — H&P (Signed)
  He reports no change in overall condition since the day of the office visit.  He has continued to have pain associated with the right inguinal hernia. The site was marked  Discussed plan for surgery  Peter Congo.D.

## 2015-01-09 NOTE — Transfer of Care (Signed)
Immediate Anesthesia Transfer of Care Note  Patient: Anthony Macdonald  Procedure(s) Performed: Procedure(s): Right inguinal hernia repair  (Right)  Patient Location: PACU  Anesthesia Type:General  Level of Consciousness: Alert, Awake, Oriented  Airway & Oxygen Therapy: Patient Spontanous Breathing  Post-op Assessment: Report given to RN  Post vital signs: Reviewed and stable  Last Vitals:  Filed Vitals:   01/09/15 1455  BP: 118/74  Pulse: 77  Temp: 36.3 C  Resp: 16    Complications: No apparent anesthesia complications

## 2015-01-11 ENCOUNTER — Encounter: Payer: Self-pay | Admitting: Surgery

## 2018-12-08 ENCOUNTER — Encounter: Payer: Self-pay | Admitting: Emergency Medicine

## 2018-12-08 ENCOUNTER — Other Ambulatory Visit: Payer: Self-pay

## 2018-12-08 ENCOUNTER — Emergency Department
Admission: EM | Admit: 2018-12-08 | Discharge: 2018-12-08 | Disposition: A | Discharge status: Home / Self Care | Payer: Medicare Other | Attending: Student in an Organized Health Care Education/Training Program | Admitting: Student in an Organized Health Care Education/Training Program

## 2018-12-08 ENCOUNTER — Emergency Department: Payer: Medicare Other

## 2018-12-08 DIAGNOSIS — I10 Essential (primary) hypertension: Secondary | ICD-10-CM | POA: Diagnosis not present

## 2018-12-08 DIAGNOSIS — M25532 Pain in left wrist: Secondary | ICD-10-CM | POA: Diagnosis not present

## 2018-12-08 DIAGNOSIS — M542 Cervicalgia: Secondary | ICD-10-CM | POA: Insufficient documentation

## 2018-12-08 DIAGNOSIS — Z87891 Personal history of nicotine dependence: Secondary | ICD-10-CM | POA: Insufficient documentation

## 2018-12-08 DIAGNOSIS — Z79899 Other long term (current) drug therapy: Secondary | ICD-10-CM | POA: Insufficient documentation

## 2018-12-08 DIAGNOSIS — M069 Rheumatoid arthritis, unspecified: Secondary | ICD-10-CM | POA: Insufficient documentation

## 2018-12-08 MED ORDER — HYDROCODONE-ACETAMINOPHEN 5-325 MG PO TABS
1.0000 | ORAL_TABLET | Freq: Once | ORAL | Status: AC
  Administered 2018-12-08: 1 via ORAL
  Filled 2018-12-08: qty 1

## 2018-12-08 MED ORDER — CYCLOBENZAPRINE HCL 5 MG PO TABS: 5.0000 mg | ORAL_TABLET | Freq: Three times a day (TID) | ORAL | 0 refills | Status: AC | PRN

## 2018-12-08 NOTE — ED Notes (Signed)
Anthony Macdonald- Wife   [phone]
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
## 2018-12-17 ENCOUNTER — Emergency Department
Admission: EM | Admit: 2018-12-17 | Discharge: 2018-12-17 | Disposition: A | Discharge status: Home / Self Care | Payer: Medicare Other | Attending: Emergency Medicine | Admitting: Emergency Medicine

## 2018-12-17 ENCOUNTER — Other Ambulatory Visit: Payer: Self-pay

## 2018-12-17 DIAGNOSIS — L720 Epidermal cyst: Secondary | ICD-10-CM | POA: Diagnosis not present

## 2018-12-17 DIAGNOSIS — E119 Type 2 diabetes mellitus without complications: Secondary | ICD-10-CM | POA: Diagnosis not present

## 2018-12-17 DIAGNOSIS — Z87891 Personal history of nicotine dependence: Secondary | ICD-10-CM | POA: Diagnosis not present

## 2018-12-17 DIAGNOSIS — Z79899 Other long term (current) drug therapy: Secondary | ICD-10-CM | POA: Diagnosis not present

## 2018-12-17 DIAGNOSIS — I10 Essential (primary) hypertension: Secondary | ICD-10-CM | POA: Insufficient documentation

## 2018-12-17 NOTE — Discharge Instructions (Addendum)
Follow discharge care instruction using warm compresses to area.  Advised anti-inflammatory medication such as ibuprofen or naproxen.  Follow-up with PCP to check progress of lymph nodes.
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
## 2020-05-04 ENCOUNTER — Other Ambulatory Visit: Payer: Self-pay | Admitting: Family Medicine

## 2020-05-04 DIAGNOSIS — Z87891 Personal history of nicotine dependence: Secondary | ICD-10-CM

## 2020-05-04 DIAGNOSIS — E7800 Pure hypercholesterolemia, unspecified: Secondary | ICD-10-CM
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
## 2024-05-28 ENCOUNTER — Other Ambulatory Visit: Payer: Self-pay | Admitting: Family Medicine

## 2024-05-28 DIAGNOSIS — E7800 Pure hypercholesterolemia, unspecified: Secondary | ICD-10-CM

## 2024-05-28 DIAGNOSIS — Z9189 Other specified personal risk factors, not elsewhere classified: Secondary | ICD-10-CM

## 2024-05-30 ENCOUNTER — Ambulatory Visit
Admission: RE | Admit: 2024-05-30 | Discharge: 2024-05-30 | Disposition: A | Payer: Self-pay | Source: Ambulatory Visit | Attending: Family Medicine | Admitting: Family Medicine

## 2024-05-30 DIAGNOSIS — E7800 Pure hypercholesterolemia, unspecified: Secondary | ICD-10-CM | POA: Insufficient documentation

## 2024-05-30 DIAGNOSIS — Z9189 Other specified personal risk factors, not elsewhere classified: Secondary | ICD-10-CM | POA: Insufficient documentation
